# Patient Record
Sex: Female | Born: 1955 | Race: White | Hispanic: No | Marital: Married | Smoking: Never smoker
Health system: Southern US, Community
[De-identification: ages and names within clinical notes are randomized; demographics above are authoritative.]

## PROBLEM LIST (undated history)

## (undated) DIAGNOSIS — I10 Essential (primary) hypertension: Secondary | ICD-10-CM

## (undated) DIAGNOSIS — K219 Gastro-esophageal reflux disease without esophagitis: Secondary | ICD-10-CM

## (undated) HISTORY — PX: TUBAL LIGATION: SHX77

## (undated) HISTORY — PX: TONSILLECTOMY: SUR1361

## (undated) HISTORY — PX: CHOLECYSTECTOMY: SHX55

## (undated) HISTORY — PX: OTHER SURGICAL HISTORY: SHX169

---

## 2000-05-05 ENCOUNTER — Other Ambulatory Visit: Admission: RE | Admit: 2000-05-05 | Discharge: 2000-05-05 | Payer: Self-pay | Admitting: Radiology

## 2002-09-18 ENCOUNTER — Other Ambulatory Visit: Admission: RE | Admit: 2002-09-18 | Discharge: 2002-09-18 | Payer: Self-pay | Admitting: *Deleted

## 2011-10-13 ENCOUNTER — Encounter (HOSPITAL_BASED_OUTPATIENT_CLINIC_OR_DEPARTMENT_OTHER): Payer: Self-pay | Admitting: *Deleted

## 2011-10-13 ENCOUNTER — Emergency Department (HOSPITAL_BASED_OUTPATIENT_CLINIC_OR_DEPARTMENT_OTHER)
Admission: EM | Admit: 2011-10-13 | Discharge: 2011-10-14 | Disposition: A | Discharge status: Home / Self Care | Payer: BC Managed Care – PPO | Attending: Emergency Medicine | Admitting: Emergency Medicine

## 2011-10-13 ENCOUNTER — Emergency Department (INDEPENDENT_AMBULATORY_CARE_PROVIDER_SITE_OTHER): Payer: BC Managed Care – PPO

## 2011-10-13 ENCOUNTER — Other Ambulatory Visit: Payer: Self-pay

## 2011-10-13 DIAGNOSIS — R160 Hepatomegaly, not elsewhere classified: Secondary | ICD-10-CM

## 2011-10-13 DIAGNOSIS — K8020 Calculus of gallbladder without cholecystitis without obstruction: Secondary | ICD-10-CM | POA: Insufficient documentation

## 2011-10-13 DIAGNOSIS — R1011 Right upper quadrant pain: Secondary | ICD-10-CM

## 2011-10-13 DIAGNOSIS — R109 Unspecified abdominal pain: Secondary | ICD-10-CM | POA: Insufficient documentation

## 2011-10-13 DIAGNOSIS — R935 Abnormal findings on diagnostic imaging of other abdominal regions, including retroperitoneum: Secondary | ICD-10-CM

## 2011-10-13 DIAGNOSIS — R110 Nausea: Secondary | ICD-10-CM

## 2011-10-13 DIAGNOSIS — K769 Liver disease, unspecified: Secondary | ICD-10-CM | POA: Insufficient documentation

## 2011-10-13 DIAGNOSIS — K219 Gastro-esophageal reflux disease without esophagitis: Secondary | ICD-10-CM | POA: Insufficient documentation

## 2011-10-13 HISTORY — DX: Gastro-esophageal reflux disease without esophagitis: K21.9

## 2011-10-13 LAB — COMPREHENSIVE METABOLIC PANEL
AST: 20 U/L (ref 0–37)
Albumin: 3.7 g/dL (ref 3.5–5.2)
Calcium: 9.2 mg/dL (ref 8.4–10.5)
Creatinine, Ser: 0.9 mg/dL (ref 0.50–1.10)
Total Protein: 7.7 g/dL (ref 6.0–8.3)

## 2011-10-13 LAB — TROPONIN I: Troponin I: <0.3 ng/mL (ref <0.30)

## 2011-10-13 LAB — DIFFERENTIAL
Basophils Absolute: 0 10*3/uL (ref 0.0–0.1)
Basophils Relative: 0 % (ref 0–1)
Eosinophils Absolute: 0.2 10*3/uL (ref 0.0–0.7)
Eosinophils Relative: 3 % (ref 0–5)
Monocytes Absolute: 0.8 10*3/uL (ref 0.1–1.0)
Neutro Abs: 3.8 10*3/uL (ref 1.7–7.7)

## 2011-10-13 LAB — URINALYSIS, ROUTINE W REFLEX MICROSCOPIC
Glucose, UA: NEGATIVE mg/dL
Ketones, ur: NEGATIVE mg/dL
Protein, ur: NEGATIVE mg/dL

## 2011-10-13 LAB — LIPASE, BLOOD: Lipase: 24 U/L (ref 11–59)

## 2011-10-13 LAB — URINE MICROSCOPIC-ADD ON

## 2011-10-13 LAB — CBC
HCT: 41.3 % (ref 36.0–46.0)
MCH: 31.1 pg (ref 26.0–34.0)
MCHC: 34.1 g/dL (ref 30.0–36.0)
RDW: 13.4 % (ref 11.5–15.5)

## 2011-10-13 MED ORDER — IOHEXOL 300 MG/ML  SOLN
100.0000 mL | Freq: Once | INTRAMUSCULAR | Status: AC | PRN
  Administered 2011-10-13: 100 mL via INTRAVENOUS

## 2011-10-13 MED ORDER — IOHEXOL 300 MG/ML  SOLN
20.0000 mL | Freq: Once | INTRAMUSCULAR | Status: AC | PRN
  Administered 2011-10-13: 20 mL via ORAL

## 2011-10-13 MED ORDER — SODIUM CHLORIDE 0.9 % IV BOLUS (SEPSIS)
1000.0000 mL | Freq: Once | INTRAVENOUS | Status: AC
  Administered 2011-10-13: 1000 mL via INTRAVENOUS

## 2011-10-13 NOTE — ED Notes (Signed)
C/O right upper abodominal pain that radiates around to her back. Decreased appetite.

## 2011-10-13 NOTE — ED Notes (Signed)
Patient transported to CT 

## 2011-10-13 NOTE — Discharge Instructions (Signed)
Follow up at [facility] tomorrow for your ultrasound. Call at 9AM to schedule appointment. Follow up with your doctors for liver masses as discussed.  Abdominal Pain Abdominal pain can be caused by many things. Your caregiver decides the seriousness of your pain by an examination and possibly blood tests and X-rays. Many cases can be observed and treated at home. Most abdominal pain is not caused by a disease and will probably improve without treatment. However, in many cases, more time must pass before a clear cause of the pain can be found. Before that point, it may not be known if you need more testing, or if hospitalization or surgery is needed. HOME CARE INSTRUCTIONS   Do not take laxatives unless directed by your caregiver.   Take pain medicine only as directed by your caregiver.   Only take over-the-counter or prescription medicines for pain, discomfort, or fever as directed by your caregiver.   Try a clear liquid diet (broth, tea, or water) for as long as directed by your caregiver. Slowly move to a bland diet as tolerated.  SEEK IMMEDIATE MEDICAL CARE IF:   The pain does not go away.   You have a fever.   You keep throwing up (vomiting).   The pain is felt only in portions of the abdomen. Pain in the right side could possibly be appendicitis. In an adult, pain in the left lower portion of the abdomen could be colitis or diverticulitis.   You pass bloody or black tarry stools.  MAKE SURE YOU:   Understand these instructions.   Will watch your condition.   Will get help right away if you are not doing well or get worse.  Document Released: 04/22/2005 Document Revised: 07/02/2011 Document Reviewed: 02/29/2008 [facility] Patient Information 2012 [place], [place].

## 2011-10-13 NOTE — ED Notes (Signed)
Pt ambulatory to bathroom without difficulty.  

## 2011-10-13 NOTE — ED Provider Notes (Signed)
History     CSN: [id number]  Arrival date & time 10/13/11  4098   First MD Initiated Contact with Patient 10/13/11 2102      Chief Complaint  Patient presents with  . Abdominal Pain    (Consider location/radiation/quality/duration/timing/severity/associated sxs/prior treatment) HPI  H/o UC pw abdominal pain x 5 days. Constant since onset and gradually worsening. Rt sided both upper and lower abdomen, suprapubic. Radiates to back. Cramping pain 3/10 a this time. Worse with walking or prolonged sitting. Denies N/V. +chills, no fever. Denies constipation/diarrhea. C/O chest pressure intermittent, none present now. After eating only. Burps and then pressure goes away.Has taken nexium, asacol. No other medications for pain pta. Denies hematuria/dysuria/freq/urgency Denies history of recent trauma/falls. Denies h/o malignancy, DM, immunocompromise  injection drug use, immunosuppression, indwelling urinary catheter, prolonged steroid use, skin or urinary tract infection. No numbness/tingling/weakness of extremities. Denies fever/chills. Denies saddle anesthesia, no urinary incontinence or retention. Intermittent lightheadedness x one week.  +diarrhea chronic with colitis  GI- [facility]. Scheduled for colonoscopy on friday  ED Notes, ED Provider Notes from 10/13/11 0000 to 10/13/11 19:40:43       Tresa Endo Randall An, RN 10/13/2011 19:38      C/O right upper abodominal pain that radiates around to her back. Decreased appetite.      Past Medical History  Diagnosis Date  . GERD (gastroesophageal reflux disease)   . Ulcerative colitis   . Cataract     Past Surgical History  Procedure Date  . Tubal ligation   . Breat tumor removed   . Tonsillectomy     No family history on file.  History  Substance Use Topics  . Smoking status: Never Smoker   . Smokeless tobacco: Not on file  . Alcohol Use: No    OB History    Grav Para Term Preterm Abortions TAB SAB Ect Mult Living               Review of Systems  All other systems reviewed and are negative.  except as noted HPI   Allergies  Red dye; Shellfish allergy; Penicillins; and Latex  Home Medications   Current Outpatient Rx  Name Route Sig Dispense Refill  . CALTRATE 600+D PLUS PO Oral Take 1 tablet by mouth daily.    Marland Kitchen CETIRIZINE HCL 10 MG PO TABS Oral Take 10 mg by mouth daily.    Marland Kitchen ESOMEPRAZOLE MAGNESIUM 40 MG PO CPDR Oral Take 40 mg by mouth daily before breakfast.    . FLUTICASONE-SALMETEROL 250-50 MCG/DOSE IN AEPB Inhalation Inhale 1 puff into the lungs 2 (two) times daily as needed. For chronic cough or wheeze    . MESALAMINE 400 MG PO TBEC Oral Take 400 mg by mouth 2 (two) times daily.    Marland Kitchen MURO 128 OP Both Eyes Place 1 drop into both eyes 4 (four) times daily as needed. For moisture balance      BP 139/98  Pulse 84  Temp(Src) 97.6 F (36.4 C) (Oral)  Resp 18  SpO2 100%  Physical Exam  Nursing note and vitals reviewed. Constitutional: She is oriented to person, place, and time. She appears well-developed.  HENT:  Head: Atraumatic.  Mouth/Throat: Oropharynx is clear and moist.  Eyes: Conjunctivae and EOM are normal. Pupils are equal, round, and reactive to light.  Neck: Normal range of motion. Neck supple.  Cardiovascular: Normal rate, regular rhythm, normal heart sounds and intact distal pulses.   Pulmonary/Chest: Effort normal and breath sounds normal. No respiratory  distress. She has no wheezes. She has no rales.  Abdominal: Soft. She exhibits no distension. There is tenderness. There is no rebound and no guarding.       +RUQ/RLQ ttp  Musculoskeletal: Normal range of motion.  Neurological: She is alert and oriented to person, place, and time. No cranial nerve deficit. She exhibits normal muscle tone.       Strength 5/5 b/l LE  Skin: Skin is warm and dry. No rash noted.  Psychiatric: She has a normal mood and affect.    Date: 10/13/2011  Rate: 81  Rhythm: normal sinus rhythm   QRS Axis: normal  Intervals: normal  ST/T Wave abnormalities: normal  Conduction Disutrbances:none  Narrative Interpretation:   Old EKG Reviewed: none available   ED Course  Procedures (including critical care time)  Labs Reviewed  URINALYSIS, ROUTINE W REFLEX MICROSCOPIC - Abnormal; Notable for the following:    APPearance CLOUDY (*)    Leukocytes, UA MODERATE (*)    All other components within normal limits  COMPREHENSIVE METABOLIC PANEL - Abnormal; Notable for the following:    Alkaline Phosphatase 148 (*)    GFR calc non Af Amer 70 (*)    GFR calc Af Amer 81 (*)    All other components within normal limits  URINE MICROSCOPIC-ADD ON - Abnormal; Notable for the following:    Squamous Epithelial / LPF FEW (*)    Bacteria, UA FEW (*)    All other components within normal limits  CBC  DIFFERENTIAL  LIPASE, BLOOD  TROPONIN I   No results found.  1. Abdominal pain   2. Cholelithiasis   3. Liver mass     MDM  PW 5 days of worsening abdominal pain. DDx broad incl cholelithiasis/biliary colic, cholecystitis, pancreatitis, less likely appendicitis, less likely spinal/back etiology. Labs reviewed and generally unremarkable. Slightly elevated AP. Contaminated urine sample. CT with two liver lesions. Gallstones and collapsed gallbladder without pericholecystic fluid.  Pt continues to decline analgesia in ED. Repeat exam unchanged-- epigastric/RUQ/RLQ ttp. Pt offered transport to [facility] for RUQ U/S eval and declined-- would like to f/u tomorrow as outpatient. Given her atypical symptoms for cholecystitis I agree that this would be appropriate w/u at this time. Pt aware of liver lesions and states she has been followed closely by her MDs for benign lesions. She will f/u with them.  Given precautions for return.        Forbes Cellar, MD 10/14/11 [phone]

## 2011-10-13 NOTE — ED Notes (Signed)
Multiple offers for pain medication given to pt by RN and MD, pt declined.

## 2011-10-14 ENCOUNTER — Ambulatory Visit (HOSPITAL_BASED_OUTPATIENT_CLINIC_OR_DEPARTMENT_OTHER)
Admission: RE | Admit: 2011-10-14 | Discharge: 2011-10-14 | Disposition: A | Discharge status: Home / Self Care | Payer: BC Managed Care – PPO | Source: Ambulatory Visit | Attending: Emergency Medicine | Admitting: Emergency Medicine

## 2011-10-14 DIAGNOSIS — R630 Anorexia: Secondary | ICD-10-CM | POA: Insufficient documentation

## 2011-10-14 DIAGNOSIS — R1011 Right upper quadrant pain: Secondary | ICD-10-CM | POA: Insufficient documentation

## 2011-10-14 DIAGNOSIS — K8020 Calculus of gallbladder without cholecystitis without obstruction: Secondary | ICD-10-CM

## 2011-10-14 DIAGNOSIS — R109 Unspecified abdominal pain: Secondary | ICD-10-CM

## 2011-10-14 DIAGNOSIS — K7689 Other specified diseases of liver: Secondary | ICD-10-CM

## 2011-10-14 NOTE — ED Notes (Signed)
Call placed to patient by Dr. Radford Pax to give test results from ultrasound.  Patient was informed to follow up with her surgeon and GI physician at [facility].

## 2013-01-11 IMAGING — CT CT ABD-PELV W/ CM
2 of 5 series · 17 of 46 positions shown, 19 images · IV contrast (APPLIED)
Comparison: None.

CLINICAL DATA: Right upper quadrant pain.  Nausea.

CT ABDOMEN AND PELVIS WITH CONTRAST
TECHNIQUE: Multidetector CT imaging of the abdomen and pelvis was
performed using the standard protocol following bolus
administration of intravenous contrast.
Contrast: 20mL OMNIPAQUE IOHEXOL 300 MG/ML IJ SOLN, 100mL OMNIPAQUE
IOHEXOL 300 MG/ML IJ SOLN

[Series 2: abd/pelvis 5.0 b31f · axial · 0.91mm/px · z∈[+728,+1118]mm · 14 of 88 slices shown, 16 images]
[im 5/88  soft-tissue]
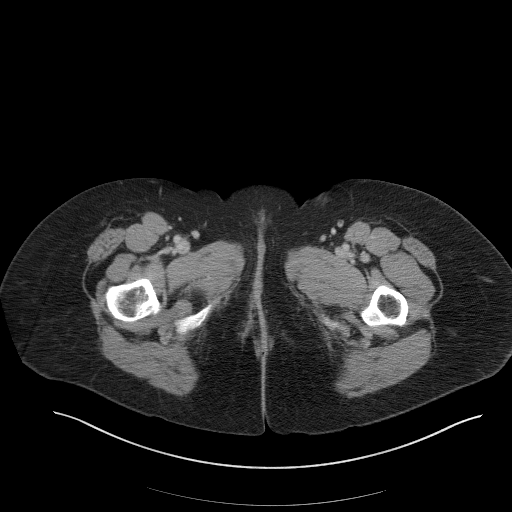
[im 5/88  bone]
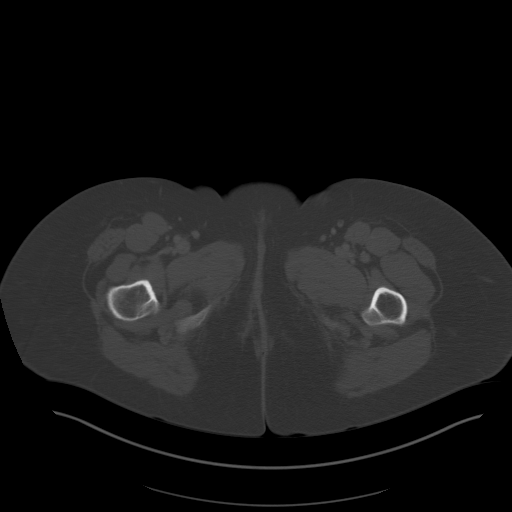
[im 14/88  soft-tissue]
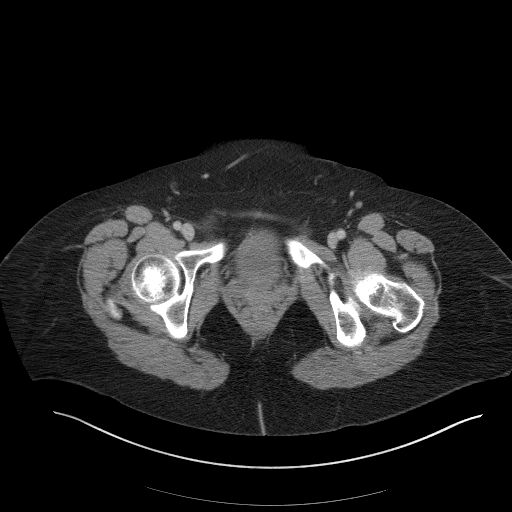
[im 18/88  soft-tissue]
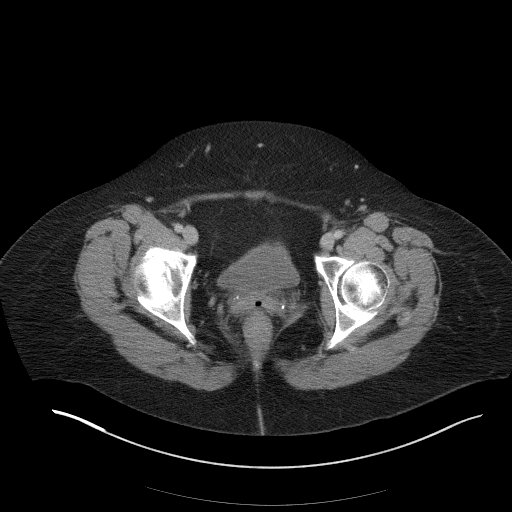
[im 22/88  soft-tissue]
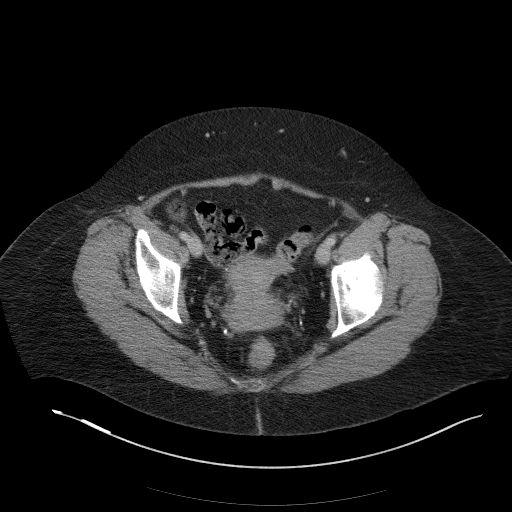
[im 31/88  soft-tissue]
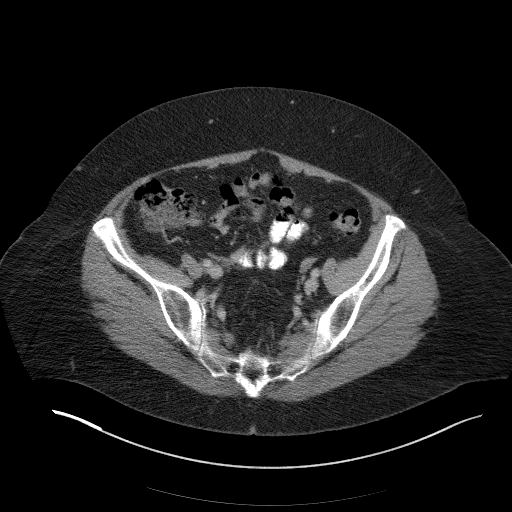
[im 35/88  soft-tissue]
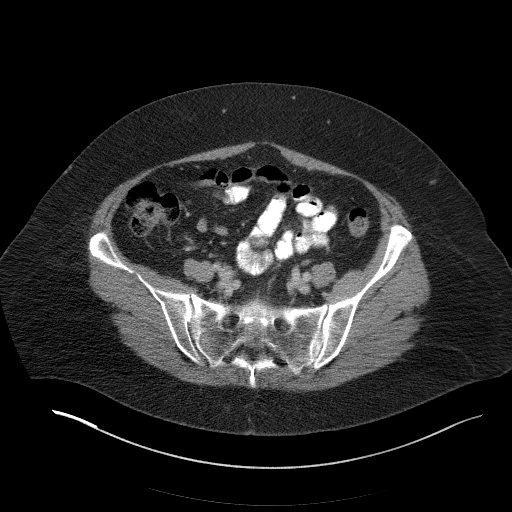
[im 40/88  soft-tissue]
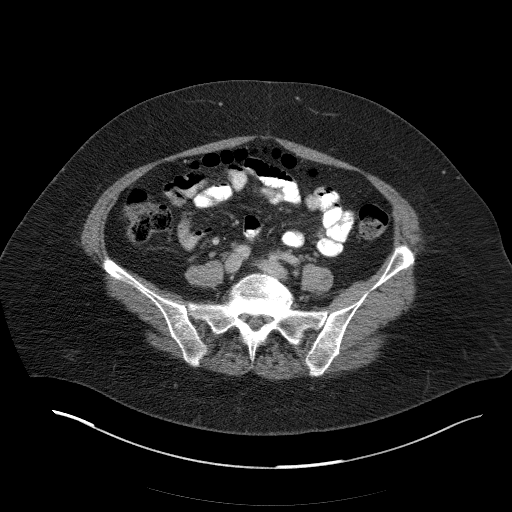
[im 48/88  soft-tissue]
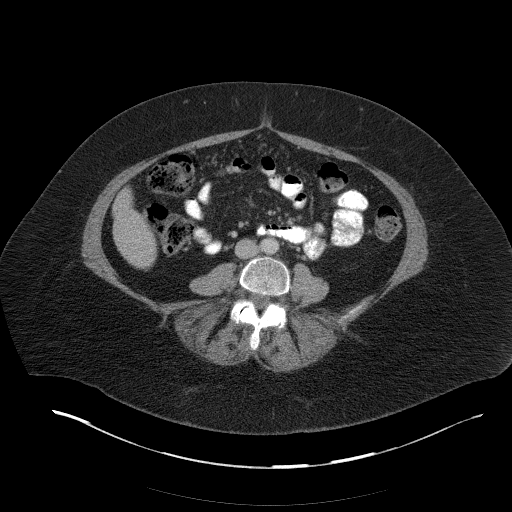
[im 53/88  soft-tissue]
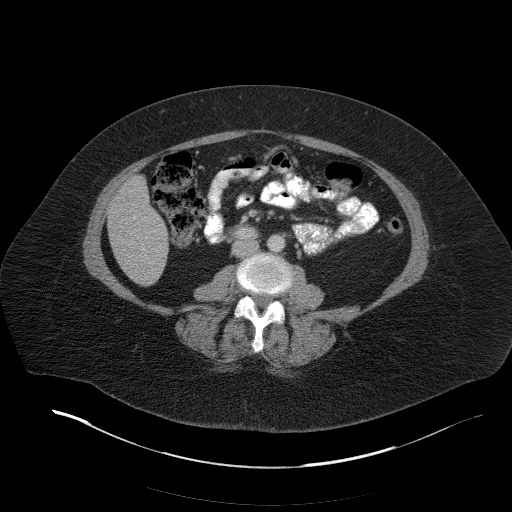
[im 53/88  bone]
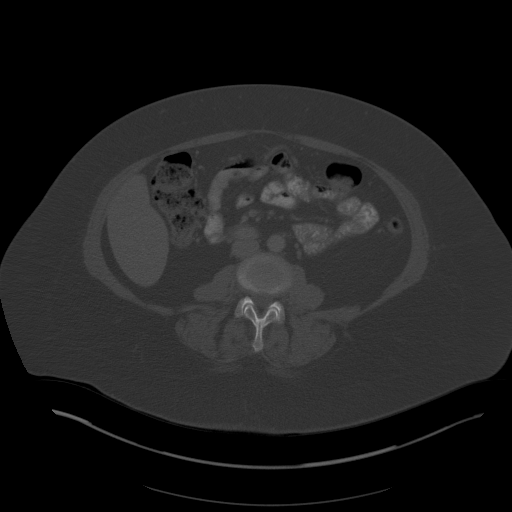
[im 57/88  soft-tissue]
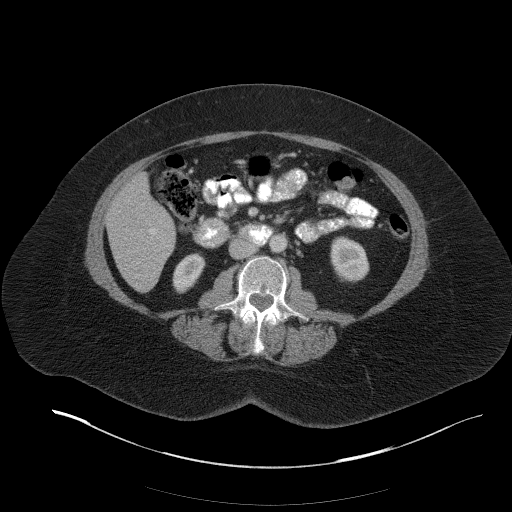
[im 66/88  soft-tissue]
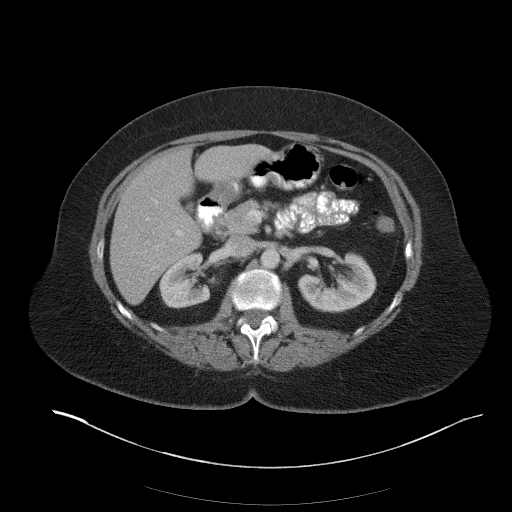
[im 70/88  soft-tissue]
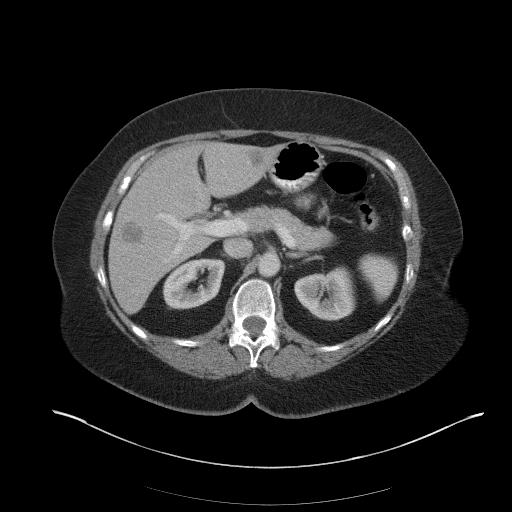
[im 74/88  soft-tissue]
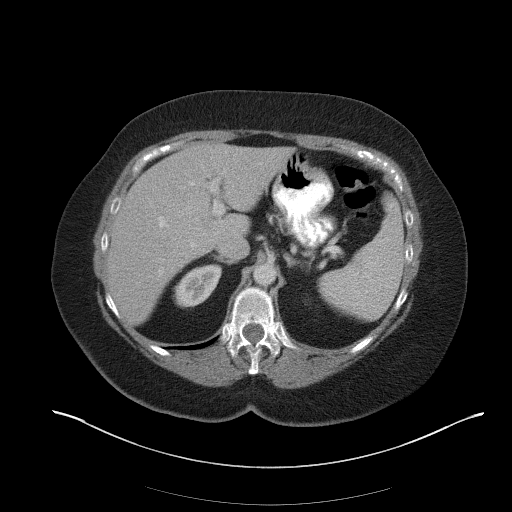
[im 83/88  soft-tissue]
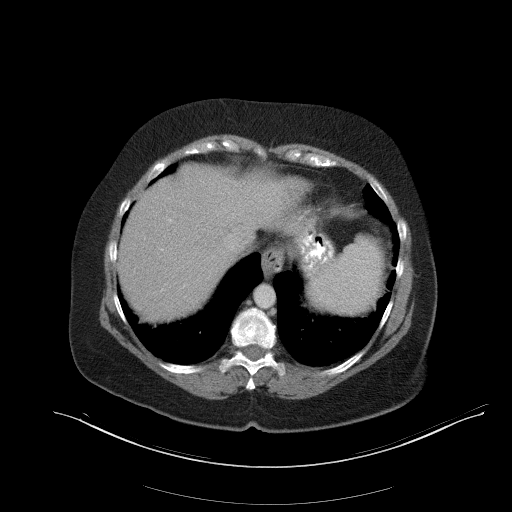

[Series 5: abd/pelvis 3.0 coronal · coronal · 0.89mm/px · 3 of 98 slices shown]
[im 33/98  soft-tissue]
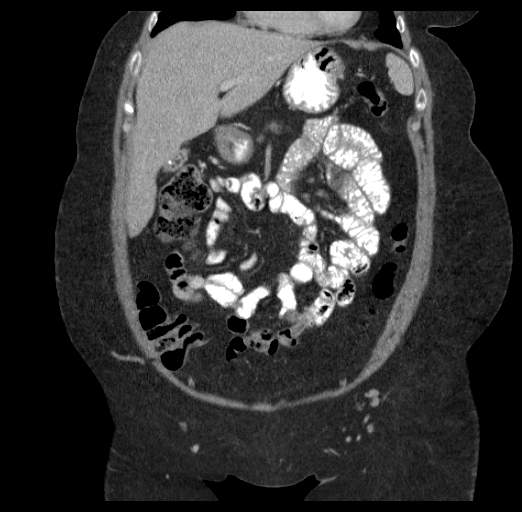
[im 44/98  soft-tissue]
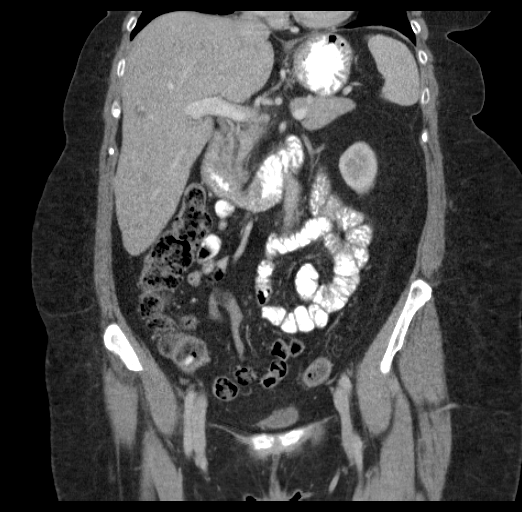
[im 54/98  soft-tissue]
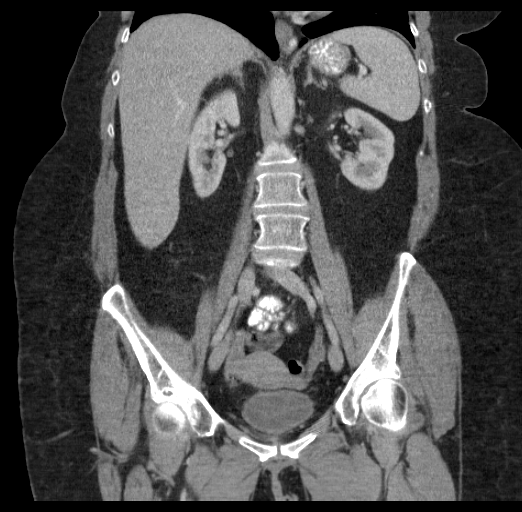

[17 of 46 positions shown; findings below may reference images not displayed]

FINDINGS: The lung bases appear clear.

No pericardial or pleural effusion.

Both adrenal glands are normal.

Indeterminant hypoechoic structure within the lateral segment left
hepatic lobe measures 1.8 cm.  Within the right hepatic lobe there
is a 2.1 cm hypodense structure.  The gallbladder appears collapsed
and is filled with multiple small gallstones.

The spleen appears normal.

The adrenal glands are normal.

The pancreas is unremarkable.

Normal appearance of the right kidney.  The left kidney is normal.

No upper abdominal adenopathy.

There is no pelvic or inguinal adenopathy.

Urinary bladder appears normal.

Uterus and adnexal structures have a normal appearance

There is a small hiatal hernia.  The stomach and small bowel loops
are otherwise unremarkable.

The colon is normal in appearance

Review of the visualized osseous structures is significant for
lumbar degenerative disc disease.  Most severe at the L5-S1 level.
IMPRESSION: 1.  There are two indeterminate low density structures within both
lobes of the liver.  These may represent benign or malignant liver
tumors. Liver metastasis not excluded.  Further evaluation with
contrast enhanced MRI of the liver is recommended.
2.  Gallstones fill a collapsed gallbladder.  No pericholecystic
fluid identified.

## 2018-04-03 ENCOUNTER — Emergency Department (HOSPITAL_BASED_OUTPATIENT_CLINIC_OR_DEPARTMENT_OTHER)
Admission: EM | Admit: 2018-04-03 | Discharge: 2018-04-03 | Disposition: A | Discharge status: Home / Self Care | Payer: No Typology Code available for payment source | Attending: Emergency Medicine | Admitting: Emergency Medicine

## 2018-04-03 ENCOUNTER — Other Ambulatory Visit: Payer: Self-pay

## 2018-04-03 ENCOUNTER — Encounter (HOSPITAL_BASED_OUTPATIENT_CLINIC_OR_DEPARTMENT_OTHER): Payer: Self-pay | Admitting: *Deleted

## 2018-04-03 DIAGNOSIS — Y999 Unspecified external cause status: Secondary | ICD-10-CM | POA: Insufficient documentation

## 2018-04-03 DIAGNOSIS — Y92009 Unspecified place in unspecified non-institutional (private) residence as the place of occurrence of the external cause: Secondary | ICD-10-CM | POA: Insufficient documentation

## 2018-04-03 DIAGNOSIS — Z23 Encounter for immunization: Secondary | ICD-10-CM | POA: Insufficient documentation

## 2018-04-03 DIAGNOSIS — I10 Essential (primary) hypertension: Secondary | ICD-10-CM | POA: Insufficient documentation

## 2018-04-03 DIAGNOSIS — Y9389 Activity, other specified: Secondary | ICD-10-CM | POA: Insufficient documentation

## 2018-04-03 DIAGNOSIS — S51812A Laceration without foreign body of left forearm, initial encounter: Secondary | ICD-10-CM | POA: Diagnosis not present

## 2018-04-03 DIAGNOSIS — Z79899 Other long term (current) drug therapy: Secondary | ICD-10-CM | POA: Diagnosis not present

## 2018-04-03 DIAGNOSIS — W540XXA Bitten by dog, initial encounter: Secondary | ICD-10-CM

## 2018-04-03 DIAGNOSIS — W541XXA Struck by dog, initial encounter: Secondary | ICD-10-CM | POA: Diagnosis not present

## 2018-04-03 DIAGNOSIS — Z91040 Latex allergy status: Secondary | ICD-10-CM | POA: Diagnosis not present

## 2018-04-03 DIAGNOSIS — S59912A Unspecified injury of left forearm, initial encounter: Secondary | ICD-10-CM | POA: Diagnosis present

## 2018-04-03 HISTORY — DX: Essential (primary) hypertension: I10

## 2018-04-03 MED ORDER — DOXYCYCLINE HYCLATE 100 MG PO TABS
100.0000 mg | ORAL_TABLET | Freq: Once | ORAL | Status: AC
  Administered 2018-04-03: 100 mg via ORAL
  Filled 2018-04-03: qty 1

## 2018-04-03 MED ORDER — LIDOCAINE HCL 2 % IJ SOLN
INTRAMUSCULAR | Status: AC
  Filled 2018-04-03: qty 20

## 2018-04-03 MED ORDER — DOXYCYCLINE HYCLATE 100 MG PO CAPS: 100.0000 mg | ORAL_CAPSULE | Freq: Two times a day (BID) | ORAL | 0 refills | Status: AC

## 2018-04-03 MED ORDER — METRONIDAZOLE 500 MG PO TABS: 500.0000 mg | ORAL_TABLET | Freq: Two times a day (BID) | ORAL | 0 refills | Status: AC

## 2018-04-03 MED ORDER — METRONIDAZOLE 500 MG PO TABS
500.0000 mg | ORAL_TABLET | Freq: Once | ORAL | Status: AC
  Administered 2018-04-03: 500 mg via ORAL
  Filled 2018-04-03: qty 1

## 2018-04-03 MED ORDER — TETANUS-DIPHTH-ACELL PERTUSSIS 5-2.5-18.5 LF-MCG/0.5 IM SUSP
0.5000 mL | Freq: Once | INTRAMUSCULAR | Status: AC
  Administered 2018-04-03: 0.5 mL via INTRAMUSCULAR
  Filled 2018-04-03: qty 0.5

## 2018-04-03 MED ORDER — LIDOCAINE HCL 2 % IJ SOLN
20.0000 mL | Freq: Once | INTRAMUSCULAR | Status: AC
  Administered 2018-04-03: 400 mg
  Filled 2018-04-03: qty 20

## 2018-04-03 NOTE — ED Notes (Signed)
ED Provider at bedside. 

## 2018-04-03 NOTE — Discharge Instructions (Signed)
Take doxycycline and flagyl as prescribed.   Put bacitracin to the wound to prevent infection   You need to return in 2 days for wound check and in a week for suture removal   Return to ER sooner if you have fever, worse redness around the wound, purulent discharge

## 2018-04-03 NOTE — ED Triage Notes (Signed)
Pt reports she was trying to separate her dogs that were fighting. Large bite with exposed fat present to left forearm. Dogs are UTD on rabies vaccine

## 2018-04-03 NOTE — ED Provider Notes (Signed)
[facility] EMERGENCY DEPARTMENT Provider Note   CSN: [id number] Arrival date & time: 04/03/18  1945     History   Chief Complaint Chief Complaint  Patient presents with  . Animal Bite    HPI Rachel Lowe is a 62 y.o. female history of ulcerative colitis, hypertension here presenting with dog bite.  Patient states that she has 2 dogs at home and the dogs were fighting so she tried to separate them.  When the dogs accidentally bit her in the left forearm.  She states that there is some fat exposed.  She states that the dogs are up-to-date with their shots including the rabies vaccine.  She does not remember when her last tetanus shot was.  The history is provided by the patient.    Past Medical History:  Diagnosis Date  . Cataract   . GERD (gastroesophageal reflux disease)   . Hypertension   . Ulcerative colitis     There are no active problems to display for this patient.   Past Surgical History:  Procedure Laterality Date  . Breat Tumor Removed    . CHOLECYSTECTOMY    . TONSILLECTOMY    . TUBAL LIGATION    . TUBAL LIGATION       OB History   None      Home Medications    Prior to Admission medications   Medication Sig Start Date End Date Taking? Authorizing Provider  Calcium Carbonate-Vit D-Min (CALTRATE 600+D PLUS PO) Take 1 tablet by mouth daily.    [provider]  cetirizine (ZYRTEC) 10 MG tablet Take 10 mg by mouth daily.    [provider]  esomeprazole (NEXIUM) 40 MG capsule Take 40 mg by mouth daily before breakfast.    [provider]  Fluticasone-Salmeterol (ADVAIR) 250-50 MCG/DOSE AEPB Inhale 1 puff into the lungs 2 (two) times daily as needed. For chronic cough or wheeze    [provider]  mesalamine (ASACOL) 400 MG EC tablet Take 400 mg by mouth 2 (two) times daily.    [provider]  Sodium Chloride, Hypertonic, (MURO 128 OP) Place 1 drop into both eyes 4 (four) times daily as needed. For  moisture balance    [provider]    Family History No family history on file.  Social History Social History   Tobacco Use  . Smoking status: Never Smoker  . Smokeless tobacco: Never Used  Substance Use Topics  . Alcohol use: No  . Drug use: No     Allergies   Red dye; Shellfish allergy; Ibuprofen; Penicillins; and Latex   Review of Systems Review of Systems  Skin: Positive for wound.  All other systems reviewed and are negative.    Physical Exam Updated Vital Signs BP (!) 158/143 (BP Location: Left Arm)   Pulse (!) 108   Temp 98.8 F (37.1 C) (Oral)   Resp (!) 21   Ht 5\' 8"  (1.727 m)   Wt 102.1 kg   SpO2 100%   BMI 34.21 kg/m   Physical Exam  Constitutional:  Uncomfortable   HENT:  Head: Normocephalic.  Eyes: Pupils are equal, round, and reactive to light.  Neck: Normal range of motion.  Cardiovascular: Normal rate.  Pulmonary/Chest: Effort normal.  Abdominal: Soft.  Musculoskeletal:  L forearm with two complicated lacerations. One laceration was around 10 cm. There is another one 15 cm with fat exposed. Neurovascular intact L upper extremity   Neurological: She is alert.  Skin: Skin is  warm.  Psychiatric: She has a normal mood and affect.  Nursing note and vitals reviewed.      ED Treatments / Results  Labs (all labs ordered are listed, but only abnormal results are displayed) Labs Reviewed - No data to display  EKG None  Radiology No results found.  Procedures Procedures (including critical care time)  LACERATION REPAIR Performed by: Richardean Canal Authorized by: Richardean Canal Consent: Verbal consent obtained. Risks and benefits: risks, benefits and alternatives were discussed Consent given by: patient Patient identity confirmed: provided demographic data Prepped and Draped in normal sterile fashion Wound explored  Laceration Location: L forearm   Laceration Length: 10 cm  No Foreign Bodies seen or  palpated  Anesthesia: local infiltration  Local anesthetic: lidocaine 2 % no epinephrine  Anesthetic total: 10 ml  Irrigation method: syringe Amount of cleaning: standard  Skin closure: multi layer  Number of sutures: 4 4-0 vicryl subcutaneous and 6 4-0 ethilon simple interrupted  Technique: see above   Patient tolerance: Patient tolerated the procedure well with no immediate complications.  LACERATION REPAIR Performed by: Richardean Canal Authorized by: Richardean Canal Consent: Verbal consent obtained. Risks and benefits: risks, benefits and alternatives were discussed Consent given by: patient Patient identity confirmed: provided demographic data Prepped and Draped in normal sterile fashion Wound explored  Laceration Location: L forearm  Laceration Length: 15 cm  No Foreign Bodies seen or palpated  Anesthesia: local infiltration  Local anesthetic: lidocaine 2% no epinephrine  Anesthetic total: 15 ml  Irrigation method: syringe Amount of cleaning: standard  Skin closure: multi layer  Number of sutures: 8 4-0 vicryl subcutaneous and 12 4-0 ethilon simple interrupted  Technique: see above   Patient tolerance: Patient tolerated the procedure well with no immediate complications.   Medications Ordered in ED Medications  lidocaine (XYLOCAINE) 2 % (with pres) injection (has no administration in time range)  Tdap (BOOSTRIX) injection 0.5 mL (0.5 mLs Intramuscular Given 04/03/18 2032)  lidocaine (XYLOCAINE) 2 % (with pres) injection 400 mg (400 mg Infiltration Given 04/03/18 2032)  doxycycline (VIBRA-TABS) tablet 100 mg (100 mg Oral Given 04/03/18 2033)  metroNIDAZOLE (FLAGYL) tablet 500 mg (500 mg Oral Given 04/03/18 2033)     Initial Impression / Assessment and Plan / ED Course  I have reviewed the triage vital signs and the nursing notes.  Pertinent labs & imaging results that were available during my care of the patient were reviewed by me and considered in my medical  decision making (see chart for details).     Rachel Lowe is a 62 y.o. female here with dog bite. She had fat exposed. Her dogs are up to date with shots. Tdap updated in the ED. Wound explored and there is no obvious foreign body. No tendons visualized. I irrigated wound extensively. I performed multi layer closure and loosely approximated the wound. She has PCN allergy so I ordered doxycycline, flagyl. Will bring her back in 2 days for wound check, a week for suture removal.    Final Clinical Impressions(s) / ED Diagnoses   Final diagnoses:  None    ED Discharge Orders    None       Charlynne Pander, MD 04/03/18 2145

## 2018-04-05 ENCOUNTER — Other Ambulatory Visit: Payer: Self-pay

## 2018-04-05 ENCOUNTER — Emergency Department (HOSPITAL_BASED_OUTPATIENT_CLINIC_OR_DEPARTMENT_OTHER)
Admission: EM | Admit: 2018-04-05 | Discharge: 2018-04-05 | Disposition: A | Discharge status: Home / Self Care | Payer: No Typology Code available for payment source | Attending: Emergency Medicine | Admitting: Emergency Medicine

## 2018-04-05 ENCOUNTER — Encounter (HOSPITAL_BASED_OUTPATIENT_CLINIC_OR_DEPARTMENT_OTHER): Payer: Self-pay | Admitting: Emergency Medicine

## 2018-04-05 DIAGNOSIS — Z4801 Encounter for change or removal of surgical wound dressing: Secondary | ICD-10-CM | POA: Insufficient documentation

## 2018-04-05 DIAGNOSIS — Z5189 Encounter for other specified aftercare: Secondary | ICD-10-CM

## 2018-04-05 NOTE — ED Triage Notes (Signed)
Pt was told to come today for a wound recheck following a dog bite to L arm on Sunday.

## 2018-04-05 NOTE — Discharge Instructions (Addendum)
You were seen in the emergency department for a recheck of the wound on your left forearm.  The wound appears to be healing appropriately.  Please continue to apply antibiotic ointment as well as nonstick bandages.  Please complete your antibiotic course as prescribed by Dr. Silverio Lay.   The stitches will need to be removed in 5 days from today's visit (09/15).  Return to the ER, go to an urgent care, or see your primary care provider for this to be done.  Return to the ER sooner for new or worsening symptoms including but not limited to streaking or spreading redness from the area, purulent drainage from the area, fever, or any other concerns that you may have.

## 2018-04-05 NOTE — ED Notes (Signed)
Antibiotic applied to affected site and covered with dry dressing.  Site is slightly red and bruised.

## 2018-04-05 NOTE — ED Provider Notes (Signed)
[facility] EMERGENCY DEPARTMENT Provider Note   CSN: [id number] Arrival date & time: 04/05/18  1248     History   Chief Complaint Chief Complaint  Patient presents with  . Wound Check    HPI Rachel Lowe is a 62 y.o. female with a history of hypertension and ulcerative colitis who presents to the emergency department for wound recheck today.  Per chart review patient seen in the emergency department 04/03/18 for dog bite injuries to the left forearm, her dogs were noted to be UTD on vaccinations including rabies, her tetanus was updated, and she underwent laceration repair x2. Placed on doxycycline and flagyl with instructions for wound recheck in 2 days. Patient states she has been taking medications as prescribed and changing bandages with application of topical abx ointment. Reports some expected soreness to the area, otherwise doing well. No specific alleviating/aggravating factors. Denies fever, chills, streaking redness, or purulent drainage. Denies numbness, tingling, or weakness.   HPI  Past Medical History:  Diagnosis Date  . Cataract   . GERD (gastroesophageal reflux disease)   . Hypertension   . Ulcerative colitis     There are no active problems to display for this patient.   Past Surgical History:  Procedure Laterality Date  . Breat Tumor Removed    . CHOLECYSTECTOMY    . TONSILLECTOMY    . TUBAL LIGATION    . TUBAL LIGATION       OB History   None      Home Medications    Prior to Admission medications   Medication Sig Start Date End Date Taking? Authorizing Provider  Calcium Carbonate-Vit D-Min (CALTRATE 600+D PLUS PO) Take 1 tablet by mouth daily.    [provider]  cetirizine (ZYRTEC) 10 MG tablet Take 10 mg by mouth daily.    [provider]  doxycycline (VIBRAMYCIN) 100 MG capsule Take 1 capsule (100 mg total) by mouth 2 (two) times daily. One po bid x 7 days 04/03/18   Charlynne Pander, MD  esomeprazole (NEXIUM) 40  MG capsule Take 40 mg by mouth daily before breakfast.    [provider]  Fluticasone-Salmeterol (ADVAIR) 250-50 MCG/DOSE AEPB Inhale 1 puff into the lungs 2 (two) times daily as needed. For chronic cough or wheeze    [provider]  mesalamine (ASACOL) 400 MG EC tablet Take 400 mg by mouth 2 (two) times daily.    [provider]  metroNIDAZOLE (FLAGYL) 500 MG tablet Take 1 tablet (500 mg total) by mouth 2 (two) times daily. One po bid x 7 days 04/03/18   Charlynne Pander, MD  Sodium Chloride, Hypertonic, (MURO 128 OP) Place 1 drop into both eyes 4 (four) times daily as needed. For moisture balance    [provider]    Family History No family history on file.  Social History Social History   Tobacco Use  . Smoking status: Never Smoker  . Smokeless tobacco: Never Used  Substance Use Topics  . Alcohol use: No  . Drug use: No     Allergies   Red dye; Shellfish allergy; Ibuprofen; Penicillins; and Latex   Review of Systems Review of Systems  Constitutional: Negative for chills and fever.  Skin: Positive for wound. Negative for color change.  Neurological: Negative for weakness and numbness.     Physical Exam Updated Vital Signs BP (!) 146/92 (BP Location: Right Arm)   Pulse 92   Temp 98.9 F (37.2 C) (Oral)  Resp 18   Ht 5\' 8"  (1.727 m)   Wt 102 kg   SpO2 99%   BMI 34.19 kg/m   Physical Exam  Constitutional: She appears well-developed and well-nourished. No distress.  HENT:  Head: Normocephalic and atraumatic.  Eyes: Conjunctivae are normal. Right eye exhibits no discharge. Left eye exhibits no discharge.  Cardiovascular:  2+ symmetric radial pulses.   Musculoskeletal:  Normal range of motion to the elbow, wrist, and all digits bilaterally.  Neurological: She is alert.  Clear speech.  5 out of 5 symmetric grip strength.  Sensation grossly intact bilateral upper extremities.  Skin: Capillary refill takes less than 2  seconds.  Left forearm: There are two healing lacerations with sutures in place to the L forearm. Sutures appear intact- 12 sutures to distal wound, 6 sutures to proximal wound. Distal wound has some mild ecchymosis. No streaking or significant erythema. Not overly warm to touch. No purulent drainage.  No palpable induration or fluctuance.  Compartments are soft.  Psychiatric: She has a normal mood and affect. Her behavior is normal. Thought content normal.  Nursing note and vitals reviewed.   ED Treatments / Results  Labs (all labs ordered are listed, but only abnormal results are displayed) Labs Reviewed - No data to display  EKG None  Radiology No results found.  Procedures Procedures (including critical care time)  Medications Ordered in ED Medications - No data to display   Initial Impression / Assessment and Plan / ED Course  I have reviewed the triage vital signs and the nursing notes.  Pertinent labs & imaging results that were available during my care of the patient were reviewed by me and considered in my medical decision making (see chart for details).  Patient presents to the emergency department for wound recheck after suture closure s/p dog bite injuries 2 days prior.  Wounds appear to be healing well.  Sutures are intact.  No significant surrounding erythema, warmth, or purulent drainage at this time.  Neurovascularly intact distally.  Bandages changed in the emergency department.  Continue prescribed antibiotics.  Return to ER for suture removal in 5 days per recommendation by prior provider (7 days from initial visit), return sooner for signs of infection. I discussed  treatment plan, need for follow-up, and return precautions with the patient. Provided opportunity for questions, patient confirmed understanding and is in agreement with plan.    Final Clinical Impressions(s) / ED Diagnoses   Final diagnoses:  Visit for wound check    ED Discharge Orders    None         Cherly Anderson, PA-C 04/05/18 1332    Mesner, Barbara Cower, MD 04/06/18 [phone]

## 2018-04-05 NOTE — ED Notes (Signed)
ED Provider at bedside. 

## 2018-04-11 ENCOUNTER — Emergency Department (HOSPITAL_BASED_OUTPATIENT_CLINIC_OR_DEPARTMENT_OTHER)
Admission: EM | Admit: 2018-04-11 | Discharge: 2018-04-11 | Disposition: A | Discharge status: Home / Self Care | Payer: PRIVATE HEALTH INSURANCE | Attending: Emergency Medicine | Admitting: Emergency Medicine

## 2018-04-11 ENCOUNTER — Encounter (HOSPITAL_BASED_OUTPATIENT_CLINIC_OR_DEPARTMENT_OTHER): Payer: Self-pay | Admitting: *Deleted

## 2018-04-11 ENCOUNTER — Other Ambulatory Visit: Payer: Self-pay

## 2018-04-11 DIAGNOSIS — X58XXXD Exposure to other specified factors, subsequent encounter: Secondary | ICD-10-CM | POA: Insufficient documentation

## 2018-04-11 DIAGNOSIS — S51812D Laceration without foreign body of left forearm, subsequent encounter: Secondary | ICD-10-CM | POA: Diagnosis not present

## 2018-04-11 DIAGNOSIS — Z4802 Encounter for removal of sutures: Secondary | ICD-10-CM | POA: Diagnosis present

## 2018-04-11 NOTE — Discharge Instructions (Addendum)
Return here as needed. Follow up with your doctor. Keep the area clean and dry

## 2018-04-11 NOTE — ED Notes (Signed)
ED Provider at bedside. 

## 2018-04-11 NOTE — ED Triage Notes (Signed)
Pt here for suture removal place 9/10. Left arm

## 2018-04-12 NOTE — ED Provider Notes (Signed)
[facility] EMERGENCY DEPARTMENT Provider Note   CSN: [id number] Arrival date & time: 04/11/18  1339     History   Chief Complaint Chief Complaint  Patient presents with  . Suture / Staple Removal    HPI Rachel Lowe is a 62 y.o. female.  HPI Patient presents to the emergency department for suture removal. Past Medical History:  Diagnosis Date  . Cataract   . GERD (gastroesophageal reflux disease)   . Hypertension   . Ulcerative colitis     There are no active problems to display for this patient.   Past Surgical History:  Procedure Laterality Date  . Breat Tumor Removed    . CHOLECYSTECTOMY    . TONSILLECTOMY    . TUBAL LIGATION    . TUBAL LIGATION       OB History   None      Home Medications    Prior to Admission medications   Medication Sig Start Date End Date Taking? Authorizing Provider  Calcium Carbonate-Vit D-Min (CALTRATE 600+D PLUS PO) Take 1 tablet by mouth daily.    [provider]  cetirizine (ZYRTEC) 10 MG tablet Take 10 mg by mouth daily.    [provider]  doxycycline (VIBRAMYCIN) 100 MG capsule Take 1 capsule (100 mg total) by mouth 2 (two) times daily. One po bid x 7 days 04/03/18   Charlynne PanderYao, David Hsienta, MD  esomeprazole (NEXIUM) 40 MG capsule Take 40 mg by mouth daily before breakfast.    [provider]  Fluticasone-Salmeterol (ADVAIR) 250-50 MCG/DOSE AEPB Inhale 1 puff into the lungs 2 (two) times daily as needed. For chronic cough or wheeze    [provider]  mesalamine (ASACOL) 400 MG EC tablet Take 400 mg by mouth 2 (two) times daily.    [provider]  metroNIDAZOLE (FLAGYL) 500 MG tablet Take 1 tablet (500 mg total) by mouth 2 (two) times daily. One po bid x 7 days 04/03/18   Charlynne PanderYao, David Hsienta, MD  Sodium Chloride, Hypertonic, (MURO 128 OP) Place 1 drop into both eyes 4 (four) times daily as needed. For moisture balance    [provider]    Family History History  reviewed. No pertinent family history.  Social History Social History   Tobacco Use  . Smoking status: Never Smoker  . Smokeless tobacco: Never Used  Substance Use Topics  . Alcohol use: No  . Drug use: No     Allergies   Red dye; Shellfish allergy; Ibuprofen; Penicillins; and Latex   Review of Systems Review of Systems All other systems negative except as documented in the HPI. All pertinent positives and negatives as reviewed in the HPI.  Physical Exam Updated Vital Signs BP (!) 136/94   Pulse 95   Temp 98.4 F (36.9 C)   Resp 18   Ht 5\' 8"  (1.727 m)   Wt 102 kg   SpO2 99%   BMI 34.19 kg/m   Physical Exam  Constitutional: Rachel Lowe is oriented to person, place, and time. Rachel Lowe appears well-developed and well-nourished. No distress.  HENT:  Head: Normocephalic and atraumatic.  Eyes: Pupils are equal, round, and reactive to light.  Pulmonary/Chest: Effort normal.  Musculoskeletal:       Arms: Neurological: Rachel Lowe is alert and oriented to person, place, and time.  Skin: Skin is warm and dry.  Psychiatric: Rachel Lowe has a normal mood and affect.  Nursing note and vitals reviewed.    ED Treatments / Results  Labs (all  labs ordered are listed, but only abnormal results are displayed) Labs Reviewed - No data to display  EKG None  Radiology No results found.  Procedures Procedures (including critical care time)  Medications Ordered in ED Medications - No data to display   Initial Impression / Assessment and Plan / ED Course  I have reviewed the triage vital signs and the nursing notes.  Pertinent labs & imaging results that were available during my care of the patient were reviewed by me and considered in my medical decision making (see chart for details).     SUTURE REMOVAL Performed by: Jamesetta Orleans Davie Sagona  Consent: Verbal consent obtained. Patient identity confirmed: provided demographic data Time out: Immediately prior to procedure a "time out" was called  to verify the correct patient, procedure, equipment, support staff and site/side marked as required.  Location details: Left forearm  Wound Appearance: clean  Sutures/Staples Removed: Sutures 15  Facility: sutures placed in this facility Patient tolerance: Patient tolerated the procedure well with no immediate complications.   '  Final Clinical Impressions(s) / ED Diagnoses   Final diagnoses:  Visit for suture removal    ED Discharge Orders    None       Charlestine Night, PA-C 04/12/18 2027    Pricilla Loveless, MD 04/13/18 1512
